# Patient Record
Sex: Male | Born: 2000 | Race: Black or African American | Hispanic: No | Marital: Single
Health system: Southern US, Community
[De-identification: ages and names within clinical notes are randomized; demographics above are authoritative.]

---
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
## 2019-08-26 DIAGNOSIS — R05 Cough: Secondary | ICD-10-CM | POA: Diagnosis not present

## 2020-01-30 ENCOUNTER — Ambulatory Visit: Payer: Federal, State, Local not specified - PPO | Attending: Internal Medicine

## 2020-01-30 DIAGNOSIS — Z23 Encounter for immunization: Secondary | ICD-10-CM

## 2020-01-30 NOTE — Progress Notes (Signed)
   Covid-19 Vaccination Clinic  Name:  Carlos Brock    MRN: [id number] DOB: 2001/05/12  01/30/2020  [name] was observed post Covid-19 immunization for 15 minutes without incident. He was provided with Vaccine Information Sheet and instruction to access the V-Safe system.   [name] was instructed to call 911 with any severe reactions post vaccine: Marland Kitchen Difficulty breathing  . Swelling of face and throat  . A fast heartbeat  . A bad rash all over body  . Dizziness and weakness   Immunizations Administered    Name Date Dose VIS Date Route   Pfizer COVID-19 Vaccine 01/30/2020 10:21 AM 0.3 mL 11/11/2018 Intramuscular   Manufacturer: [employer], [employer]   Lot: [id number]   NDC: [address]-1

## 2020-02-20 ENCOUNTER — Ambulatory Visit: Payer: Federal, State, Local not specified - PPO | Attending: Internal Medicine

## 2020-02-20 DIAGNOSIS — Z23 Encounter for immunization: Secondary | ICD-10-CM

## 2020-02-20 NOTE — Progress Notes (Signed)
   Covid-19 Vaccination Clinic  Name:  Carlos Brock    MRN: [id number] DOB: [birth year]  02/20/2020  Mr. Carlos Brock was observed post Covid-19 immunization for 15 minutes without incident. He was provided with Vaccine Information Sheet and instruction to access the V-Safe system.   Mr. Carlos Brock was instructed to call 911 with any severe reactions post vaccine: Marland Kitchen Difficulty breathing  . Swelling of face and throat  . A fast heartbeat  . A bad rash all over body  . Dizziness and weakness   Immunizations Administered    Name Date Dose VIS Date Route   Pfizer COVID-19 Vaccine 02/20/2020 10:04 AM 0.3 mL 11/11/2018 Intramuscular   Manufacturer: [employer], [employer]   Lot: [id number]   NDC: [address]-5

## 2020-08-03 DIAGNOSIS — Z20828 Contact with and (suspected) exposure to other viral communicable diseases: Secondary | ICD-10-CM | POA: Diagnosis not present

## 2021-06-01 DIAGNOSIS — Z0000 Encounter for general adult medical examination without abnormal findings: Secondary | ICD-10-CM | POA: Diagnosis not present
